# Patient Record
Sex: Male | Born: 1985 | Hispanic: No | Smoking: Never smoker
Health system: Southern US, Community
[De-identification: ages and names within clinical notes are randomized; demographics above are authoritative.]

## PROBLEM LIST (undated history)

## (undated) DIAGNOSIS — E785 Hyperlipidemia, unspecified: Secondary | ICD-10-CM

---
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
## 2009-11-04 ENCOUNTER — Ambulatory Visit: Payer: Self-pay | Admitting: Orthopedic Surgery

## 2009-11-07 ENCOUNTER — Ambulatory Visit: Payer: Self-pay | Admitting: Orthopedic Surgery

## 2010-07-10 ENCOUNTER — Ambulatory Visit: Payer: Self-pay | Admitting: Orthopedic Surgery

## 2011-05-25 IMAGING — CT CT OF THE LEFT KNEE WITHOUT CONTRAST
3 series · 16 of 33 positions shown, 19 images · non-contrast
Comparison: none

REASON FOR EXAM: CR [id number] 3 D Reconstruction  Left Knee Fx Due to
Injury
COMMENTS:

PROCEDURE:     CT  - CT KNEE LEFT WO  - November 04, 2009 [DATE]
RESULT:     Comparison: None.
TECHNIQUE: Multiple sequential axial images are obtained of the left knee,
without intravenous contrast. Coronal and sagittal reformats were performed.
3-D reconstructions were also performed.

[Series 3: bone windows · axial · 0.35mm/px · z∈[-862,-730]mm · 8 of 54 slices shown, 10 images]
[im 5/54  soft-tissue]
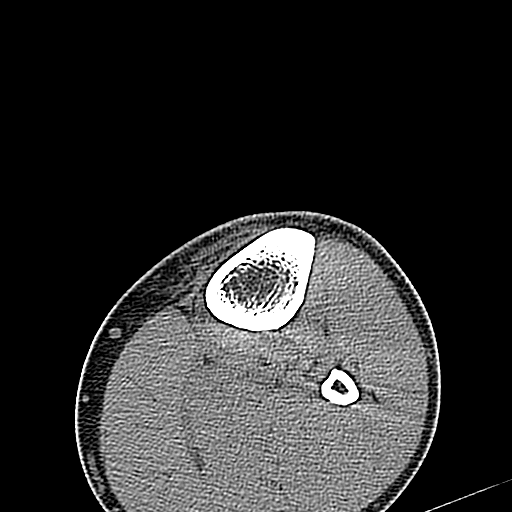
[im 5/54  bone]
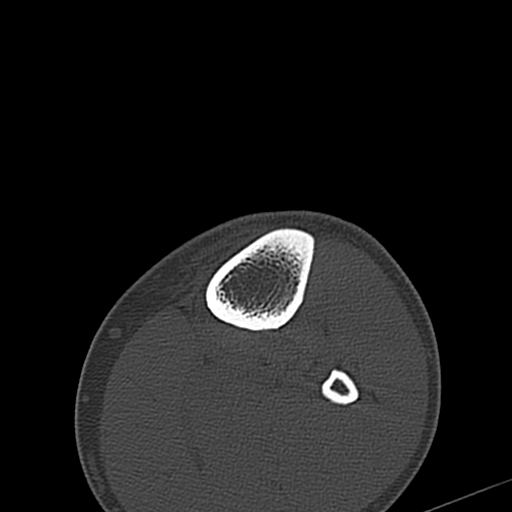
[im 13/54  bone]
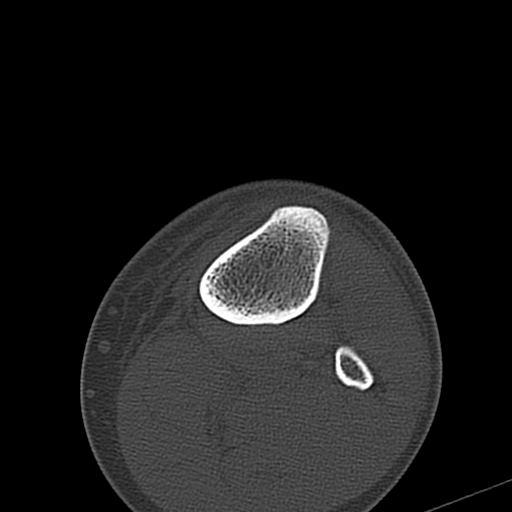
[im 17/54  bone]
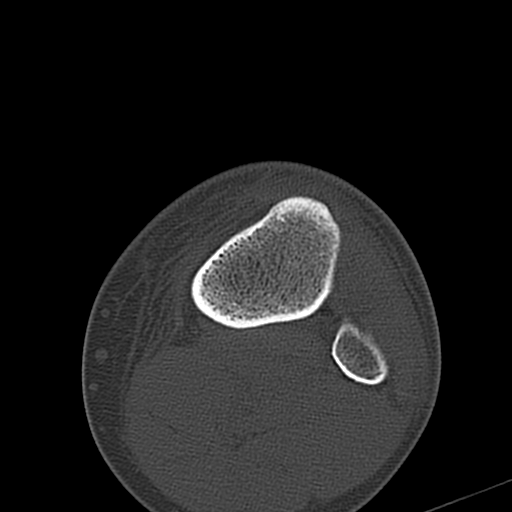
[im 25/54  bone]
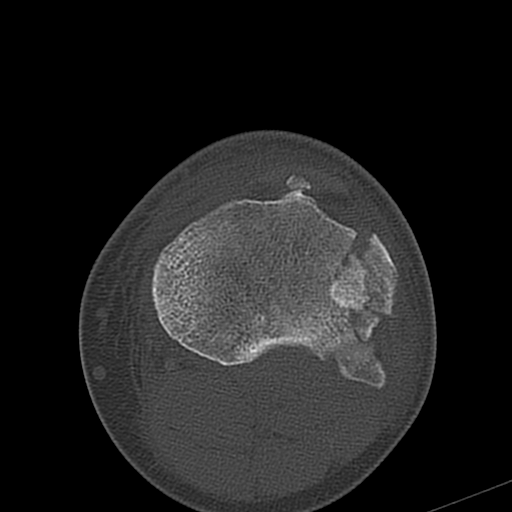
[im 29/54  soft-tissue]
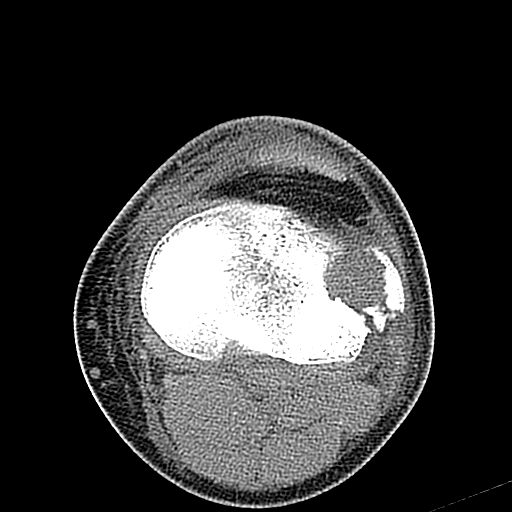
[im 29/54  bone]
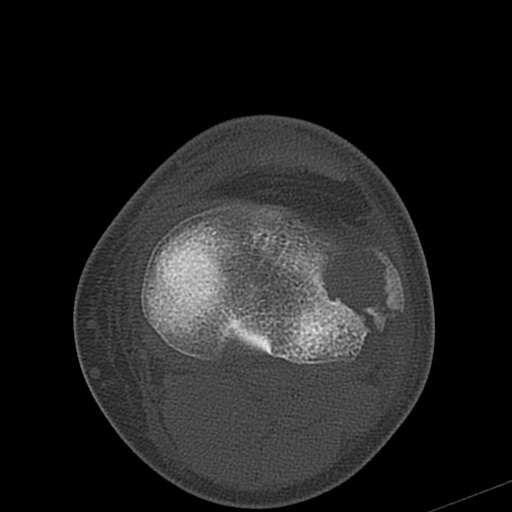
[im 37/54  bone]
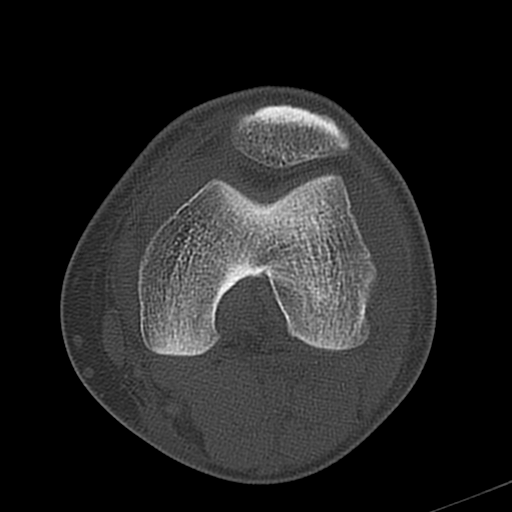
[im 41/54  bone]
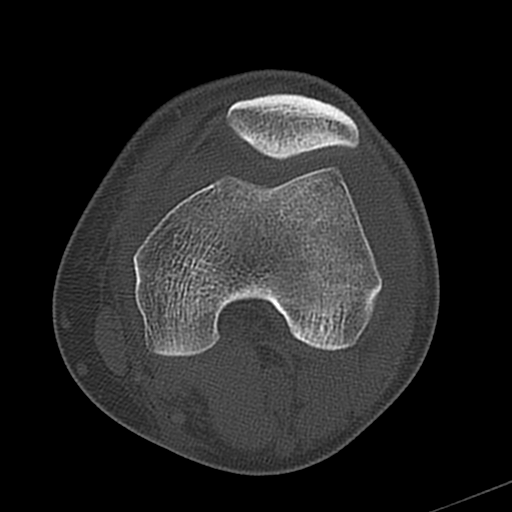
[im 49/54  bone]
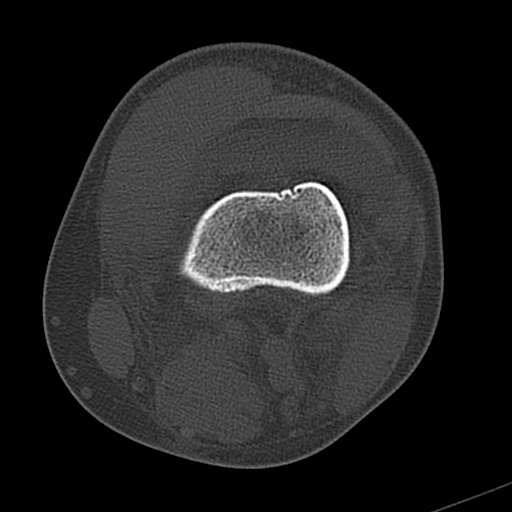

[Series 5: coronal · coronal · 0.36mm/px · 3 of 54 slices shown]
[im 11/54  bone]
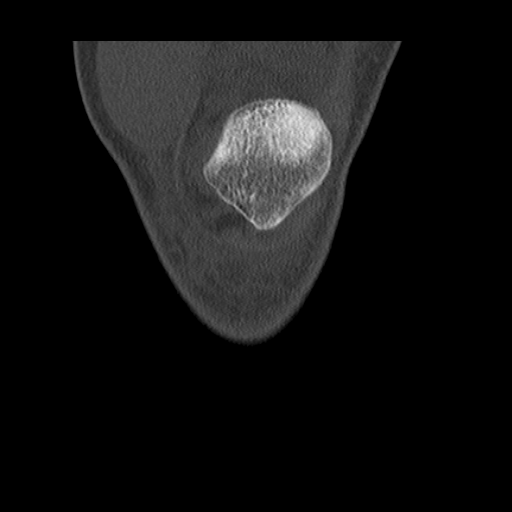
[im 22/54  bone]
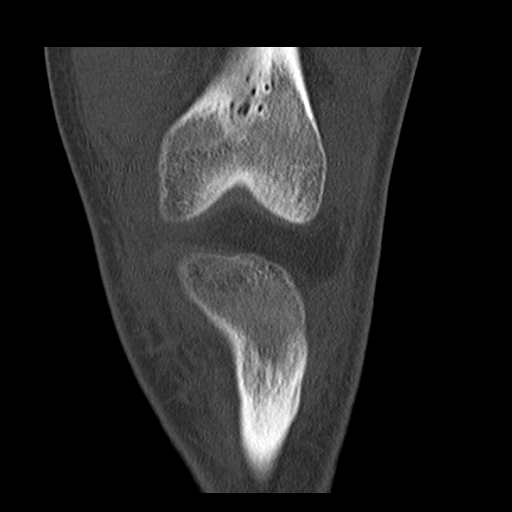
[im 32/54  bone]
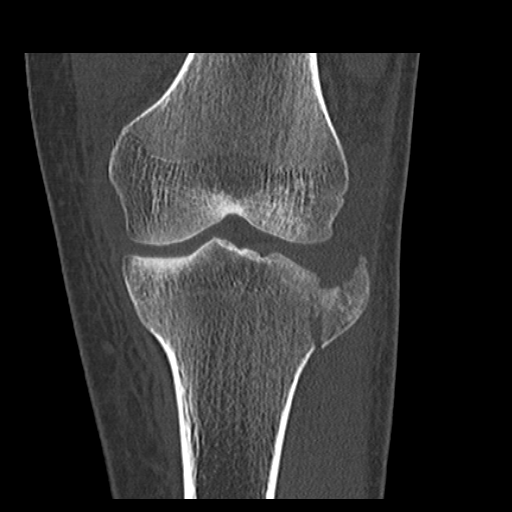

[Series 6: sagital · sagittal · 0.36mm/px · 5 of 53 slices shown, 6 images]
[im 18/53  bone]
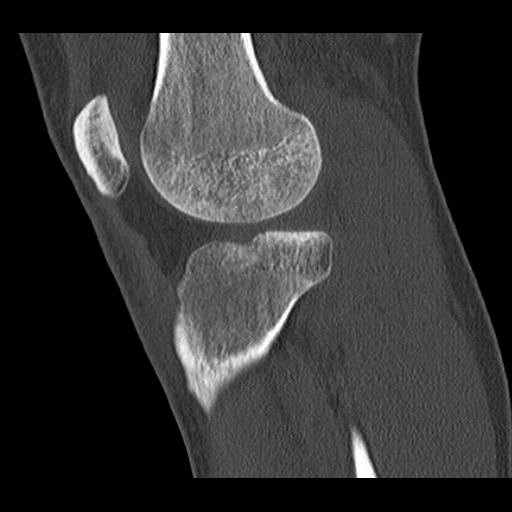
[im 22/53  bone]
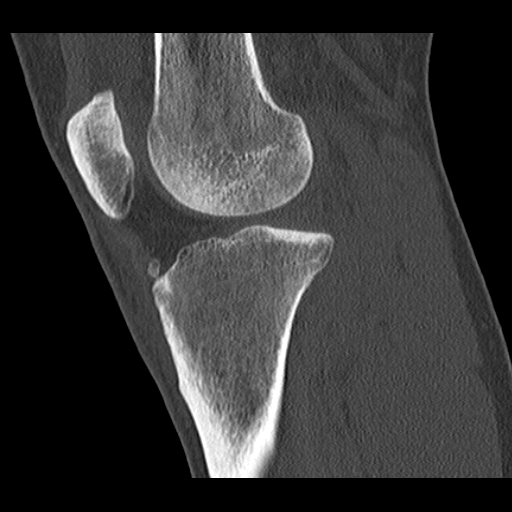
[im 27/53  soft-tissue]
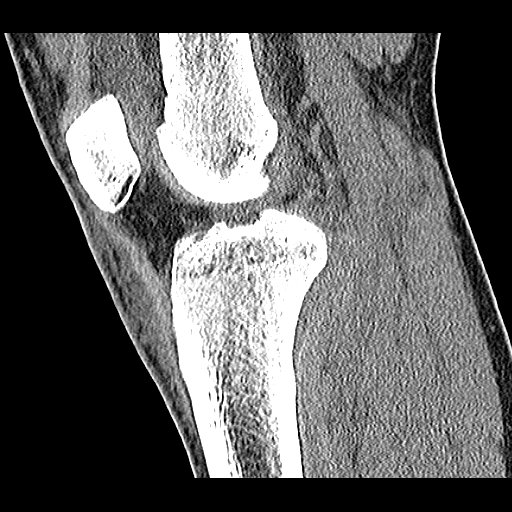
[im 27/53  bone]
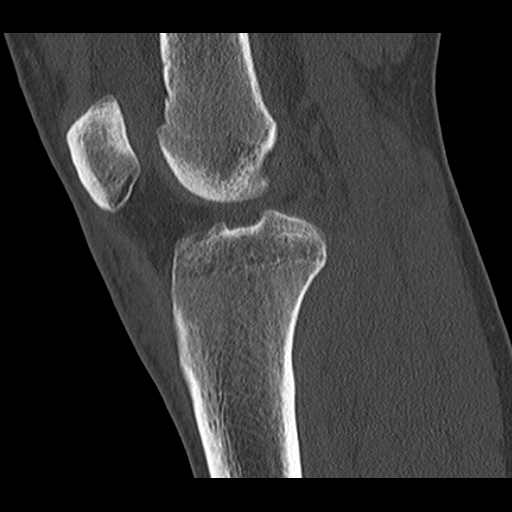
[im 31/53  bone]
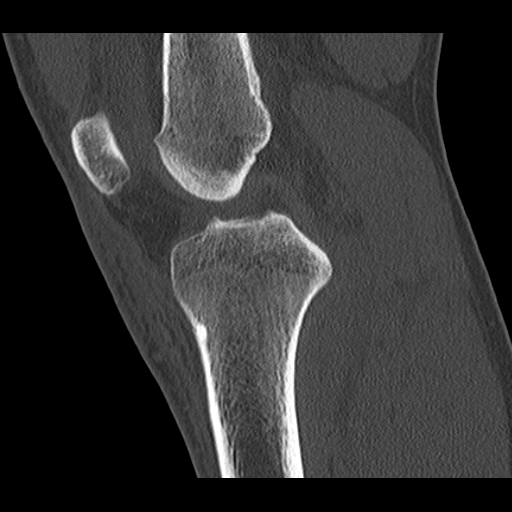
[im 35/53  bone]
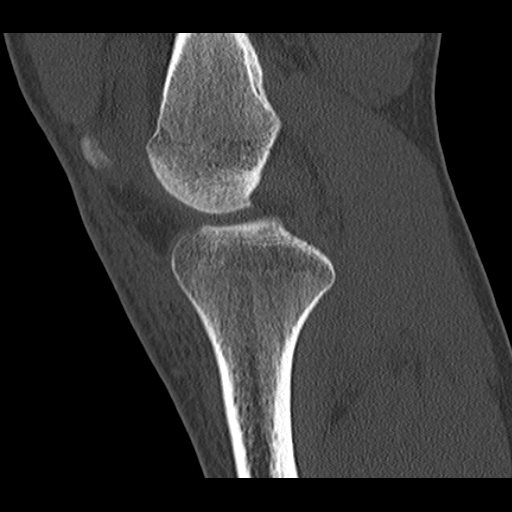

[16 of 33 positions shown; findings below may reference images not displayed]

FINDINGS: There is a depressed, mildly comminuted fracture of the lateral tibia
plateau. There is approximately 1 cm of depression of the articular surface.

There is a small knee effusion. Nonspecific stranding demonstrated in the
subcutaneous fat.
IMPRESSION: Depressed, mildly comminuted fracture of the lateral tibia plateau.

## 2011-05-28 IMAGING — CR DG KNEE 1-2V*L*
1 series · 2 of 2 positions shown · non-contrast
Comparison: none

REASON FOR EXAM: post op ORIF, initial xrays at [REDACTED]
COMMENTS:   LMP: (Male)

[Series 1: view not recorded · 0.17mm/px · 2 of 2 slices shown]
[im 1/2]
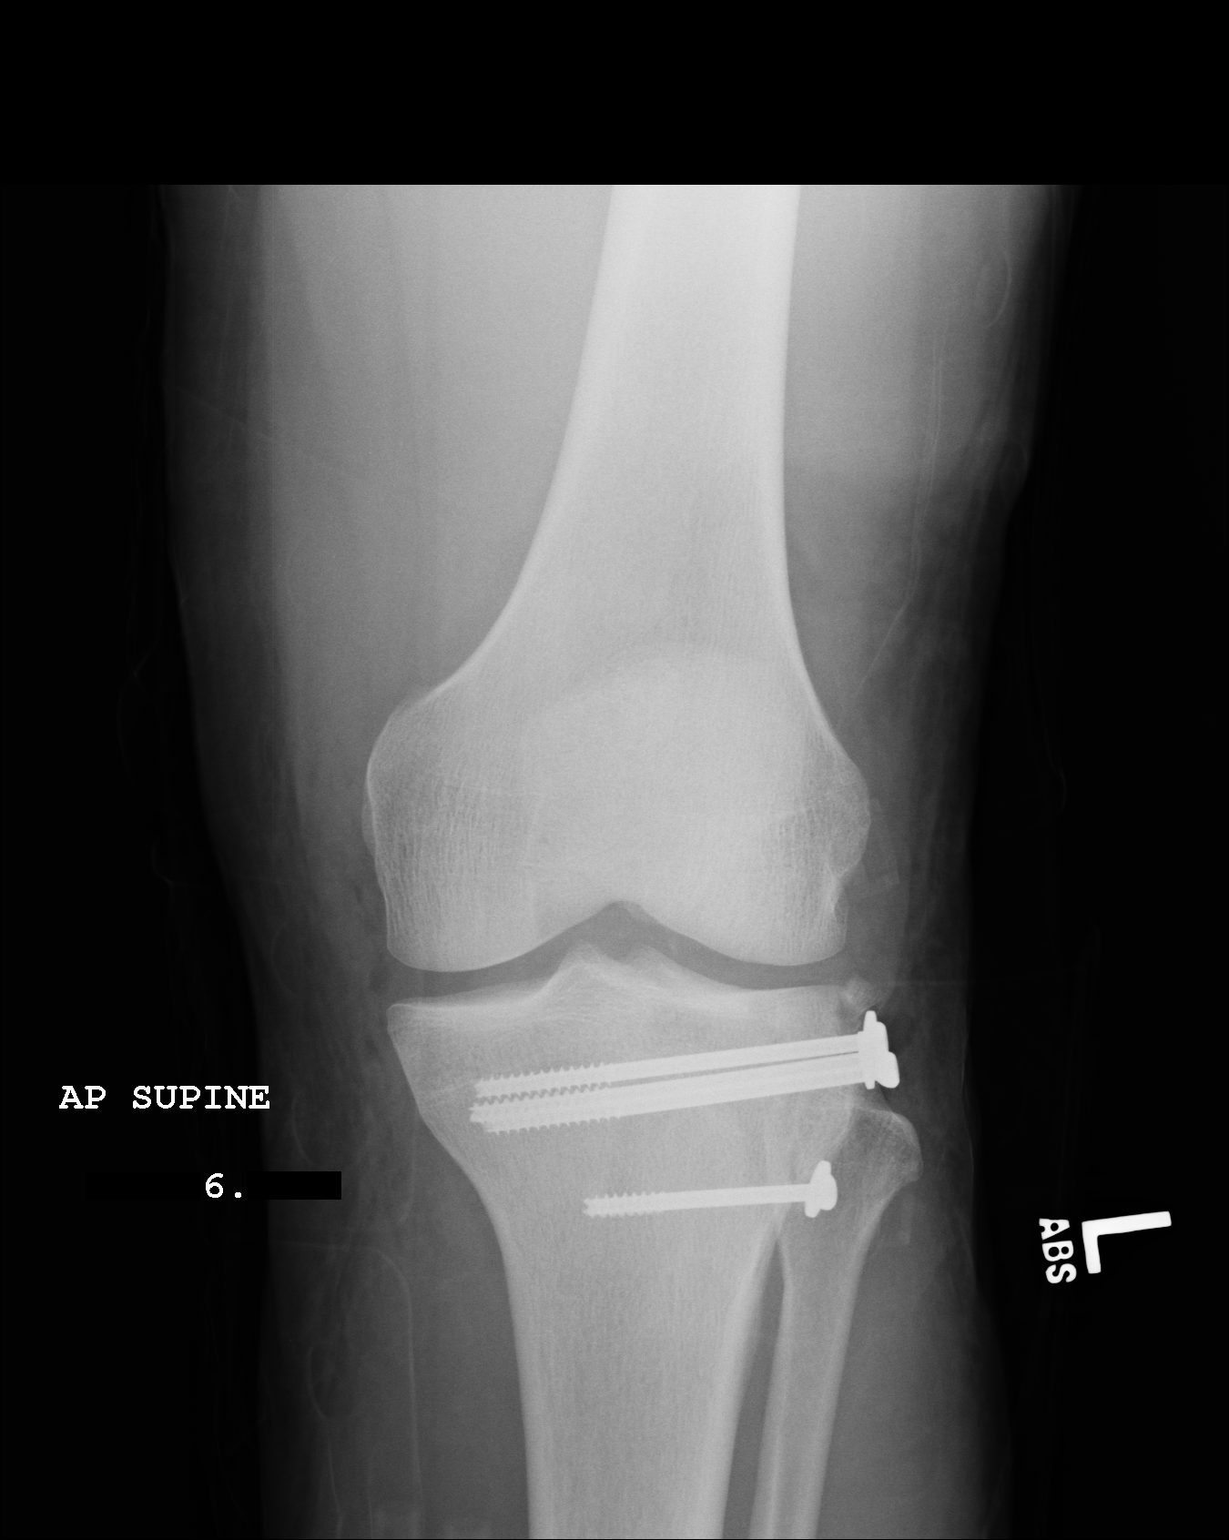
[im 2/2]
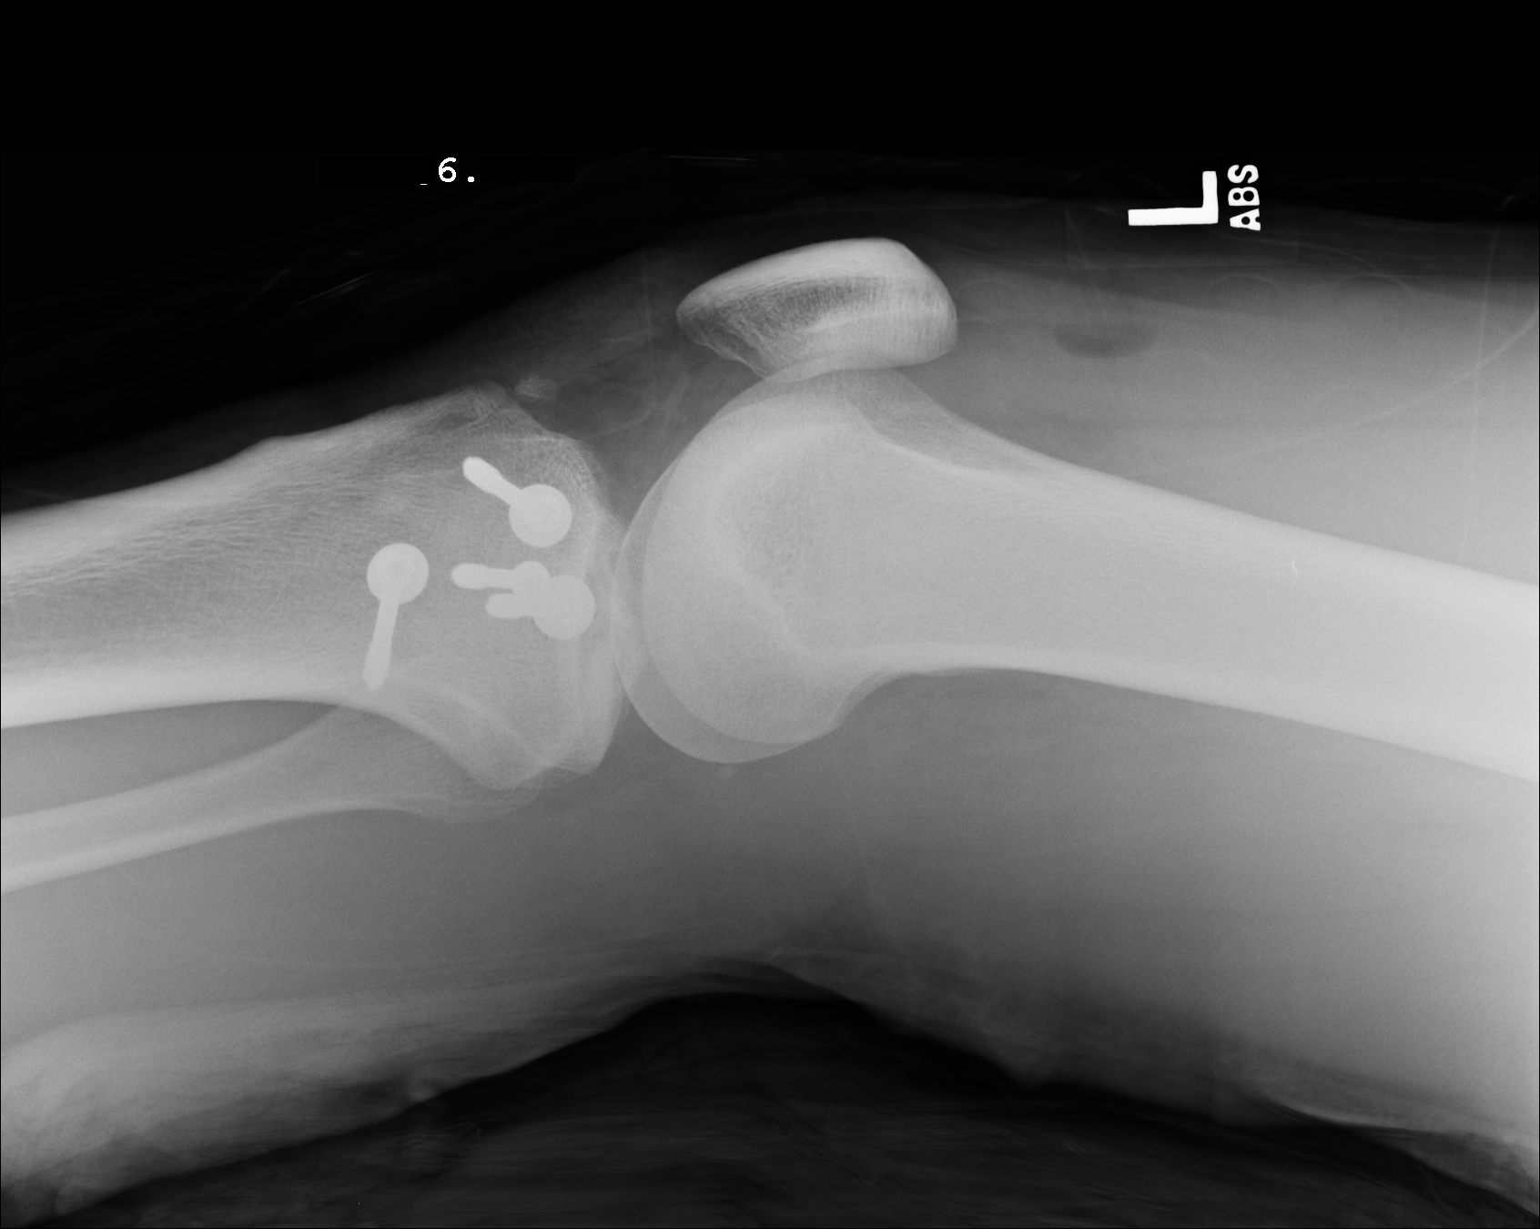

[2 of 2 positions shown; findings below may reference images not displayed]

PROCEDURE:     DXR - DXR KNEE LEFT AP AND LATERAL  - November 07, 2009  [DATE]

RESULT:     The patient is status post cannulated screw fixation of the
lateral tibial plateau region. Hardware appears intact without evidence of
loosening or failure. No further fractures or dislocations are appreciated.
IMPRESSION: Open reduction and internal fixation of the lateral tibial plateau region.
The remainder of the interpretation we left to the performing physician.

## 2017-06-18 DIAGNOSIS — E785 Hyperlipidemia, unspecified: Secondary | ICD-10-CM | POA: Insufficient documentation

## 2017-07-06 ENCOUNTER — Ambulatory Visit: Payer: Self-pay | Admitting: Nurse Practitioner

## 2017-07-11 ENCOUNTER — Ambulatory Visit
Admission: EM | Admit: 2017-07-11 | Discharge: 2017-07-11 | Disposition: A | Discharge status: Home / Self Care | Payer: Managed Care, Other (non HMO) | Attending: Family Medicine | Admitting: Family Medicine

## 2017-07-11 DIAGNOSIS — L255 Unspecified contact dermatitis due to plants, except food: Secondary | ICD-10-CM

## 2017-07-11 HISTORY — DX: Hyperlipidemia, unspecified: E78.5

## 2017-07-11 MED ORDER — PREDNISONE 10 MG PO TABS: ORAL_TABLET | ORAL | 0 refills | Status: AC

## 2017-07-11 NOTE — ED Provider Notes (Signed)
[facility] ____________________________________________  Time seen: Approximately 3:32 PM  I have reviewed the triage vital signs and the nursing notes.   HISTORY  Chief Complaint Poison Ivy   HPI Jeremy Walter is a 32 y.o. male presenting for evaluation of itchy rash that started off of bilateral forearms but continues to spread.  Patient reports that he was working outside in the woods and pulling up vines on Tuesday and reports on Wednesday started with a rash.  Reports for the last few days rash continues to spread from forearms to torso.  Reports his father also with similar he was working with him.  Denies any other changes in foods, medicines, lotions, detergents or other contacts.  States rash is itchy, denies pain.  Denies insect bites.  Reports otherwise feels well.  States unrelieved with over-the-counter calamine and topical sprays. Denies recent sickness. Denies recent antibiotic use.    Past Medical History:  Diagnosis Date  . Hyperlipidemia     Patient Active Problem List   Diagnosis Date Noted  . Hyperlipidemia, unspecified 06/18/2017    History reviewed. No pertinent surgical history.   No current facility-administered medications for this encounter.   Current Outpatient Medications:  .  atorvastatin (LIPITOR) 10 MG tablet, , Disp: , Rfl:  .  predniSONE (DELTASONE) 10 MG tablet, Take  orally day one, then 55 mg orally day two, then 50 mg orally day three, then taper by 5 mg daily over 12 days until complete., Disp: 35 tablet, Rfl: 0  Allergies Patient has no known allergies.  No family history on file.  Social History Social History   Tobacco Use  . Smoking status: Never Smoker  . Smokeless tobacco: Never Used  Substance Use Topics  . Alcohol use: Never    Frequency: Never  . Drug use: Not on file    Review of Systems Constitutional: No fever/chills Cardiovascular: Denies chest pain. Respiratory: Denies shortness of  breath. Gastrointestinal: No abdominal pain.   Musculoskeletal: Negative for back pain. Skin: positive for rash.   ____________________________________________   PHYSICAL EXAM:  VITAL SIGNS: ED Triage Vitals  Enc Vitals Group     BP 07/11/17 1501 (!) 149/78     Pulse Rate 07/11/17 1501 79     Resp 07/11/17 1501 18     Temp 07/11/17 1501 98.8 F (37.1 C)     Temp Source 07/11/17 1501 Oral     SpO2 07/11/17 1501 100 %     Weight --      Height --      Head Circumference --      Peak Flow --      Pain Score 07/11/17 1503 0     Pain Loc --      Pain Edu? --      Excl. in GC? --     Constitutional: Alert and oriented. Well appearing and in no acute distress. ENT      Head: Normocephalic and atraumatic. Cardiovascular: Normal rate, regular rhythm. Grossly normal heart sounds.  Good peripheral circulation. Respiratory: Normal respiratory effort without tachypnea nor retractions. Breath sounds are clear and equal bilaterally. No wheezes, rales, rhonchi. Musculoskeletal: Steady gait. Neurologic:  Normal speech and language.  Speech is normal. No gait instability.  Skin:  Skin is warm, dry. Except: pruritic minimally erythematous vesicular rash scattered in clusters as well as an linear patterns to bilateral forearms as well as bilateral anterior lower abdomen, no surrounding erythema, no active drainage, nontender, no edema. Psychiatric: Mood  and affect are normal. Speech and behavior are normal. Patient exhibits appropriate insight and judgment   ___________________________________________   LABS (all labs ordered are listed, but only abnormal results are displayed)  Labs Reviewed - No data to display  PROCEDURES Procedures   INITIAL IMPRESSION / ASSESSMENT AND PLAN / ED COURSE  Pertinent labs & imaging results that were available during my care of the patient were reviewed by me and considered in my medical decision making (see chart for details).  Well-appearing  patient.  No acute distress.  Suspect plant contact dermatitis versus poison oak poison ivy.  Will treat with prednisone taper.  Encourage rest and fluids, supportive care and avoidance of trigger.Discussed indication, risks and benefits of medications with patient.  Discussed follow up with Primary care physician this week. Discussed follow up and return parameters including no resolution or any worsening concerns. Patient verbalized understanding and agreed to plan.   ____________________________________________   FINAL CLINICAL IMPRESSION(S) / ED DIAGNOSES  Final diagnoses:  Contact dermatitis due to plants, except food, unspecified contact dermatitis type     ED Discharge Orders        Ordered    predniSONE (DELTASONE) 10 MG tablet     07/11/17 1516       Note: This dictation was prepared with Dragon dictation along with smaller phrase technology. Any transcriptional errors that result from this process are unintentional.         Renford Dills, NP 07/11/17 1534

## 2017-07-11 NOTE — ED Triage Notes (Signed)
Pt was in the woods on Tuesday and states his rash started on Wednesday. [place] Spaniel it is poison ivy and is located on arms and ribs currently. Did try OTC remedies without relief.

## 2017-07-11 NOTE — Discharge Instructions (Addendum)
Take medication as prescribed. Rest. Drink plenty of fluids.  ° °Follow up with your primary care physician this week as needed. Return to Urgent care for new or worsening concerns.  ° °
